# Patient Record
Sex: Male | Born: 1955 | Race: White | Hispanic: No | Marital: Single | Smoking: Never smoker
Health system: Southern US, Community
[De-identification: ages and names within clinical notes are randomized; demographics above are authoritative.]

## PROBLEM LIST (undated history)

## (undated) DIAGNOSIS — I10 Essential (primary) hypertension: Secondary | ICD-10-CM

## (undated) DIAGNOSIS — N400 Enlarged prostate without lower urinary tract symptoms: Secondary | ICD-10-CM

## (undated) DIAGNOSIS — J8410 Pulmonary fibrosis, unspecified: Secondary | ICD-10-CM

## (undated) HISTORY — PX: SHOULDER SURGERY: SHX246

---

## 2010-01-27 DIAGNOSIS — I10 Essential (primary) hypertension: Secondary | ICD-10-CM | POA: Insufficient documentation

## 2017-07-08 DIAGNOSIS — G4733 Obstructive sleep apnea (adult) (pediatric): Secondary | ICD-10-CM | POA: Insufficient documentation

## 2018-08-21 ENCOUNTER — Other Ambulatory Visit: Payer: Self-pay

## 2018-08-21 DIAGNOSIS — Z20822 Contact with and (suspected) exposure to covid-19: Secondary | ICD-10-CM

## 2018-08-22 LAB — NOVEL CORONAVIRUS, NAA: SARS-CoV-2, NAA: NOT DETECTED

## 2018-11-21 ENCOUNTER — Other Ambulatory Visit: Payer: Self-pay

## 2018-11-21 DIAGNOSIS — Z20822 Contact with and (suspected) exposure to covid-19: Secondary | ICD-10-CM

## 2018-11-23 LAB — NOVEL CORONAVIRUS, NAA: SARS-CoV-2, NAA: NOT DETECTED

## 2019-12-25 ENCOUNTER — Ambulatory Visit (HOSPITAL_COMMUNITY): Admit: 2019-12-25 | Discharge status: Against Medical Advice | Payer: Managed Care, Other (non HMO)

## 2019-12-26 ENCOUNTER — Other Ambulatory Visit: Payer: Self-pay

## 2019-12-26 ENCOUNTER — Encounter (HOSPITAL_COMMUNITY): Payer: Self-pay

## 2019-12-26 ENCOUNTER — Ambulatory Visit (HOSPITAL_COMMUNITY)
Admission: EM | Admit: 2019-12-26 | Discharge: 2019-12-26 | Disposition: A | Discharge status: Home / Self Care | Payer: Managed Care, Other (non HMO) | Attending: Internal Medicine | Admitting: Internal Medicine

## 2019-12-26 DIAGNOSIS — J029 Acute pharyngitis, unspecified: Secondary | ICD-10-CM

## 2019-12-26 DIAGNOSIS — J069 Acute upper respiratory infection, unspecified: Secondary | ICD-10-CM | POA: Diagnosis not present

## 2019-12-26 DIAGNOSIS — Z20822 Contact with and (suspected) exposure to covid-19: Secondary | ICD-10-CM | POA: Diagnosis not present

## 2019-12-26 DIAGNOSIS — R059 Cough, unspecified: Secondary | ICD-10-CM

## 2019-12-26 DIAGNOSIS — J358 Other chronic diseases of tonsils and adenoids: Secondary | ICD-10-CM | POA: Diagnosis present

## 2019-12-26 HISTORY — DX: Benign prostatic hyperplasia without lower urinary tract symptoms: N40.0

## 2019-12-26 HISTORY — DX: Pulmonary fibrosis, unspecified: J84.10

## 2019-12-26 HISTORY — DX: Essential (primary) hypertension: I10

## 2019-12-26 LAB — POCT RAPID STREP A, ED / UC: Streptococcus, Group A Screen (Direct): NEGATIVE

## 2019-12-26 LAB — SARS CORONAVIRUS 2 (TAT 6-24 HRS): SARS Coronavirus 2: NEGATIVE

## 2019-12-26 MED ORDER — CEFUROXIME AXETIL 500 MG PO TABS: 500.0000 mg | ORAL_TABLET | Freq: Two times a day (BID) | ORAL | 0 refills | Status: DC

## 2019-12-26 NOTE — Discharge Instructions (Signed)
Your rapid strep test today was negative. We will send that for culture and let you know if any treatment is needed. Check your MyChart for Covid test results.  COVID testing ordered. It will take between 3-7 days for test results. Someone will contact you regarding abnormal results or you can access your results through MyChart.   In the meantime you should... Remain isolated in your home for 10 days from symptom onset AND greater than 48 hours of no fever without the use of fever-reducing medication Get plenty of rest and fluids Flonase for nasal congestion and/or runny nose You can take OTC Zyrtec-D for nasal congestion, runny nose, and/or sore throat Use these medications as directed for symptom relief Use Tylenol or Ibuprofen as needed for fever or pain Return or go to the ER for any worsening or new symptoms such as high fever, worsening cough, shortness of breath, chest tightness, chest pain, changes in mental status, etc.

## 2019-12-26 NOTE — ED Triage Notes (Signed)
Pt presents with sore throat and productive cough X 1 week; pt states he had negative covid test on Monday & Wednesday.

## 2019-12-26 NOTE — ED Provider Notes (Signed)
[facility]    CSN: [id number] Arrival date & time: 12/26/19  0801   History   Chief Complaint Chief Complaint  Patient presents with  . Cough  . Sore Throat    HPI Fred Kennedy is a 64 y.o. male presents to urgent care today with complaints of sore throat and congestion. Patient states he flew to [place] on Friday for appointment with his PCP as he used to reside there. He was unable to see PCP due to covid symptoms. Patient reports sore throat x5 days progressed to nasal congestion. He states he felt "wheezy" last night with mild productive cough however denies any shortness of breath. Patient denies recent headache, facial pain, fever or chills, no loss of smell or taste. Somewhat better today and OTC cold and sinus medicine seems to be working. Fully vaccinated against COVID-19 with booster in 11/21. Patient requesting Covid PCR for upcoming travel.   Past Medical History:  Diagnosis Date  . Enlarged prostate   . Hypertension   . Pulmonary fibrosis, unspecified (HCC)     There are no problems to display for this patient.   Past Surgical History:  Procedure Laterality Date  . SHOULDER SURGERY        Home Medications    Prior to Admission medications   Not on File    Family History Family History  Family history unknown: Yes    Social History Social History   Tobacco Use  . Smoking status: Never Smoker     Allergies   Penicillins   Review of Systems As stated in HPI otherwise negative   Physical Exam Triage Vital Signs ED Triage Vitals  Enc Vitals Group     BP 12/26/19 0821 (!) 152/88     Pulse Rate 12/26/19 0821 78     Resp 12/26/19 0821 16     Temp 12/26/19 0821 98.1 F (36.7 C)     Temp Source 12/26/19 0821 Oral     SpO2 12/26/19 0821 99 %     Weight --      Height --      Head Circumference --      Peak Flow --      Pain Score 12/26/19 0829 5     Pain Loc --      Pain Edu? --      Excl. in GC? --    No data  found.  Updated Vital Signs BP (!) 152/88 (BP Location: Left Arm)   Pulse 78   Temp 98.1 F (36.7 C) (Oral)   Resp 16   SpO2 99%   Visual Acuity Right Eye Distance:   Left Eye Distance:   Bilateral Distance:    Right Eye Near:   Left Eye Near:    Bilateral Near:     Physical Exam Constitutional:      General: He is not in acute distress.    Appearance: He is well-developed. He is not ill-appearing.  HENT:     Head: Normocephalic and atraumatic.     Right Ear: No drainage. Tympanic membrane is not erythematous.     Left Ear: No drainage. Tympanic membrane is not erythematous.     Ears:     Comments: Positive air-fluid levels bilaterally    Nose: Congestion and rhinorrhea present.     Mouth/Throat:     Mouth: Mucous membranes are moist.     Pharynx: Uvula midline.     Comments: Mild posterior erythema without swelling. Pustular lesion on left  tonsil consistent with tonsillar stone Cardiovascular:     Rate and Rhythm: Normal rate and regular rhythm.  Pulmonary:     Effort: Pulmonary effort is normal.     Breath sounds: Normal breath sounds. No wheezing, rhonchi or rales.  Musculoskeletal:     Cervical back: Normal range of motion and neck supple.  Lymphadenopathy:     Cervical: No cervical adenopathy.  Skin:    General: Skin is warm and dry.  Neurological:     General: No focal deficit present.     Mental Status: He is alert and oriented to person, place, and time.  Psychiatric:        Mood and Affect: Mood normal.        Behavior: Behavior normal.      UC Treatments / Results  Labs (all labs ordered are listed, but only abnormal results are displayed) Labs Reviewed  SARS CORONAVIRUS 2 (TAT 6-24 HRS)  CULTURE, GROUP A STREP [facility])  POCT RAPID STREP A, ED / UC    EKG   Radiology No results found.  Procedures Procedures (including critical care time)  Medications Ordered in UC Medications - No data to display  Initial Impression / Assessment and  Plan / UC Course  I have reviewed the triage vital signs and the nursing notes.  Pertinent labs & imaging results that were available during my care of the patient were reviewed by me and considered in my medical decision making (see chart for details).    Viral URI -low suspicion for Covid-19 has pt fully immunized c booster in early fall.  -symptoms improved on OTC sinus decongestant  -rapid strep neg, will send for culture -Zyrtec, flonase, tylenol and/or motrin prn -will send covid PCR due to upcoming travel  Reviewed expections re: course of current medical issues. Questions answered. Outlined signs and symptoms indicating need for more acute intervention. Pt verbalized understanding. AVS given  Final Clinical Impressions(s) / UC Diagnoses   Final diagnoses:  Viral upper respiratory tract infection  Tonsil stone     Discharge Instructions     Your rapid strep test today was negative. We will send that for culture and let you know if any treatment is needed. Check your MyChart for Covid test results.  COVID testing ordered. It will take between 3-7 days for test results. Someone will contact you regarding abnormal results or you can access your results through MyChart.   In the meantime you should... . Remain isolated in your home for 10 days from symptom onset AND greater than 48 hours of no fever without the use of fever-reducing medication . Get plenty of rest and fluids . Flonase for nasal congestion and/or runny nose . You can take OTC Zyrtec-D for nasal congestion, runny nose, and/or sore throat . Use these medications as directed for symptom relief . Use Tylenol or Ibuprofen as needed for fever or pain . Return or go to the ER for any worsening or new symptoms such as high fever, worsening cough, shortness of breath, chest tightness, chest pain, changes in mental status, etc.     ED Prescriptions    Medication Sig Dispense Auth. Provider   cefUROXime (CEFTIN)  500 MG tablet  (Status: Discontinued) Take 1 tablet (500 mg total) by mouth 2 (two) times daily with a meal for 5 days. 10 tablet Rolla Etienne, NP     PDMP not reviewed this encounter.   Rolla Etienne, NP 12/26/19 2111

## 2019-12-28 LAB — CULTURE, GROUP A STREP (THRC)

## 2019-12-31 ENCOUNTER — Ambulatory Visit: Payer: Self-pay | Admitting: Family Medicine

## 2020-01-30 ENCOUNTER — Encounter: Payer: Self-pay | Admitting: Family Medicine

## 2020-01-30 ENCOUNTER — Ambulatory Visit: Payer: Managed Care, Other (non HMO) | Admitting: Family Medicine

## 2020-01-30 ENCOUNTER — Other Ambulatory Visit: Payer: Self-pay

## 2020-01-30 ENCOUNTER — Ambulatory Visit
Admission: RE | Admit: 2020-01-30 | Discharge: 2020-01-30 | Disposition: A | Discharge status: Home / Self Care | Payer: Managed Care, Other (non HMO) | Source: Ambulatory Visit | Attending: Family Medicine | Admitting: Family Medicine

## 2020-01-30 VITALS — BP 139/82 | HR 75 | Ht 71.5 in | Wt 229.8 lb

## 2020-01-30 DIAGNOSIS — J8410 Pulmonary fibrosis, unspecified: Secondary | ICD-10-CM

## 2020-01-30 DIAGNOSIS — G4733 Obstructive sleep apnea (adult) (pediatric): Secondary | ICD-10-CM | POA: Diagnosis not present

## 2020-01-30 DIAGNOSIS — Z0000 Encounter for general adult medical examination without abnormal findings: Secondary | ICD-10-CM

## 2020-01-30 DIAGNOSIS — R0600 Dyspnea, unspecified: Secondary | ICD-10-CM

## 2020-01-30 DIAGNOSIS — R972 Elevated prostate specific antigen [PSA]: Secondary | ICD-10-CM | POA: Diagnosis not present

## 2020-01-30 DIAGNOSIS — R0609 Other forms of dyspnea: Secondary | ICD-10-CM

## 2020-01-30 DIAGNOSIS — I10 Essential (primary) hypertension: Secondary | ICD-10-CM

## 2020-01-30 NOTE — Progress Notes (Signed)
Office Visit Note   Patient: Fred Kennedy           Date of Birth: April 14, 1955           MRN: [id number] Visit Date: 01/30/2020 Requested by: No referring provider defined for this encounter. PCP: Patient, No Pcp Per  Subjective: Chief Complaint  Patient presents with  . Other    Establish primary care  Recheck lungs post ED visit    HPI: He is here to establish care. He moved from [place] 2 years ago to work for the [employer]. He still maintains his PCP in [place], but wanted to establish with somebody local in case any issues occur.  Last month he developed a respiratory issue. He was treated with Zithromax after strep test and COVID test came back negative. His symptoms improved significantly and he does not feel sick anymore, but his girlfriend states that when he walks for exercise, he seems to get a little bit short of breath going up hills. Patient at one point was told that he might have pulmonary fibrosis but after further testing and treatment, his symptoms resolved and he was told that it might not be pulmonary fibrosis but that it could come back in the future. He is interested in getting a chest x-ray to be sure there are no abnormalities.  He has a history of elevated PSA. It has remained stable over the years at around 5. He has had multiple MRI scans of the prostate and a biopsy, the biopsy was negative and most recent MRI scans have been normal. He does have BPH symptoms and takes Flomax for that. Even still, he has difficulty with urinary stream. He would like to establish with a local urologist.  He has hypertension which has been moderately well controlled with metoprolol.  He has gained 25 pounds in the past year due to poor eating habits and not getting enough exercise. He plans to make some changes in the near future.  He has had troubles with his right knee over the years and was told based on MRI scan that he had a lot of arthritis in the joint as well as a  shredded meniscus cartilage. In the past couple months it has started to bother him more and Celebrex does not seem to be helping that much.                ROS:   All other systems were reviewed and are negative.  Objective: Vital Signs: BP 139/82   Pulse 75   Ht 5' 11.5" (1.816 m)   Wt 229 lb 12.8 oz (104.2 kg)   BMI 31.60 kg/m   Physical Exam:  General:  Alert and oriented, in no acute distress. Pulm:  Breathing unlabored. Psy:  Normal mood, congruent affect.  HEENT:  [place]/AT, PERRLA, EOM Full, no nystagmus.  Funduscopic examination within normal limits.  No conjunctival erythema.  Tympanic membranes are pearly gray with normal landmarks.  External ear canals are normal.  Nasal passages are clear.  Oropharynx is clear.  No significant lymphadenopathy.  No thyromegaly or nodules.  2+ carotid pulses without bruits. CV: Regular rate and rhythm without murmurs, rubs, or gallops.  No peripheral edema.  2+ radial and posterior tibial pulses. Lungs: Clear to auscultation throughout with no wheezing or areas of consolidation. Extremities: His right knee has trace effusion with no warmth. 1-2+ patellofemoral crepitus. Tender on the medial joint line, also a palpable click with McMurray's.    Imaging:  No results found.  Assessment & Plan: 1. status post recent respiratory infection with shortness of breath on exertion, could be simply from delayed recovery of lungs or possibly recent weight gain with deconditioning. -We will order a chest x-ray to be read by radiology. If normal, then he will work on weight loss and exercise.  2. BPH with elevated PSA -Referral to Dr. Laverle Patter.  3. Hypertension -He will monitor at home. Call for refills when needed.  4. 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 If symptoms worsen, could inject with cortisone, gel, PRP, dextrose.     Procedures: No procedures performed        PMFS  History: Patient Active Problem List   Diagnosis Date Noted  . Elevated PSA 01/30/2020  . Pulmonary fibrosis (HCC) 01/30/2020  . Obstructive sleep apnea (adult) (pediatric) 07/08/2017  . Hypertension 01/27/2010   Past Medical History:  Diagnosis Date  . Enlarged prostate   . Hypertension   . Pulmonary fibrosis, unspecified (HCC)     Family History  Family history unknown: Yes    Past Surgical History:  Procedure Laterality Date  . SHOULDER SURGERY     Social History   Occupational History  . Not on file  Tobacco Use  . Smoking status: Never Smoker  . Smokeless tobacco: Not on file  Substance and Sexual Activity  . Alcohol use: Not on file  . Drug use: Not on file  . Sexual activity: Not on file

## 2020-01-31 ENCOUNTER — Telehealth: Payer: Self-pay | Admitting: Family Medicine

## 2020-01-31 NOTE — Telephone Encounter (Signed)
Chest xray looks normal.

## 2020-07-20 ENCOUNTER — Encounter: Payer: Self-pay | Admitting: Physician Assistant

## 2020-07-20 ENCOUNTER — Telehealth: Payer: Managed Care, Other (non HMO) | Admitting: Physician Assistant

## 2020-07-20 DIAGNOSIS — H1032 Unspecified acute conjunctivitis, left eye: Secondary | ICD-10-CM | POA: Diagnosis not present

## 2020-07-20 DIAGNOSIS — U071 COVID-19: Secondary | ICD-10-CM | POA: Diagnosis not present

## 2020-07-20 MED ORDER — POLYMYXIN B-TRIMETHOPRIM 10000-0.1 UNIT/ML-% OP SOLN: 1.0000 [drp] | OPHTHALMIC | 0 refills | Status: AC

## 2020-07-20 MED ORDER — BENZONATATE 100 MG PO CAPS: 100.0000 mg | ORAL_CAPSULE | Freq: Three times a day (TID) | ORAL | 0 refills | Status: AC | PRN

## 2020-07-20 NOTE — Progress Notes (Signed)
Mr. Fred Kennedy, humm are scheduled for a virtual visit with your provider today.    Just as we do with appointments in the office, we must obtain your consent to participate.  Your consent will be active for this visit and any virtual visit you may have with one of our providers in the next 365 days.    If you have a MyChart account, I can also send a copy of this consent to you electronically.  All virtual visits are billed to your insurance company just like a traditional visit in the office.  As this is a virtual visit, video technology does not allow for your provider to perform a traditional examination.  This may limit your provider's ability to fully assess your condition.  If your provider identifies any concerns that need to be evaluated in person or the need to arrange testing such as labs, EKG, etc, we will make arrangements to do so.    Although advances in technology are sophisticated, we cannot ensure that it will always work on either your end or our end.  If the connection with a video visit is poor, we may have to switch to a telephone visit.  With either a video or telephone visit, we are not always able to ensure that we have a secure connection.   I need to obtain your verbal consent now.   Are you willing to proceed with your visit today?   [name] has provided verbal consent on 07/20/2020 for a virtual visit (video or telephone).   [name], PA-C 07/20/2020  4:45 PM  Virtual Visit Consent   [name], you are scheduled for a virtual visit with a [facility] provider today.     Just as with appointments in the office, your consent must be obtained to participate.  Your consent will be active for this visit and any virtual visit you may have with one of our providers in the next 365 days.     If you have a MyChart account, a copy of this consent can be sent to you electronically.  All virtual visits are billed to your insurance company just like a traditional  visit in the office.    As this is a virtual visit, video technology does not allow for your provider to perform a traditional examination.  This may limit your provider's ability to fully assess your condition.  If your provider identifies any concerns that need to be evaluated in person or the need to arrange testing (such as labs, EKG, etc.), we will make arrangements to do so.     Although advances in technology are sophisticated, we cannot ensure that it will always work on either your end or our end.  If the connection with a video visit is poor, the visit may have to be switched to a telephone visit.  With either a video or telephone visit, we are not always able to ensure that we have a secure connection.     I need to obtain your verbal consent now.   Are you willing to proceed with your visit today?    [name] has provided verbal consent on 07/20/2020 for a virtual visit (video or telephone).   [name], PA-C   Date: 07/20/2020 4:45 PM   Virtual Visit via Video Note   IMargaretann Loveless, connected with  [name]  ([id number], July 08, 1955) on 07/20/20 at  5:00 PM EDT by a video-enabled telemedicine application and verified that I  am speaking with the correct person using two identifiers.  Location: Patient: Virtual Visit Location Patient: Home Provider: Virtual Visit Location Provider: Home Office   I discussed the limitations of evaluation and management by telemedicine and the availability of in person appointments. The patient expressed understanding and agreed to proceed.    History of Present Illness: Fred Kennedy is a 65 y.o. who identifies as a male who was assigned male at birth, and is being seen today for Covid 19, tested positive on Saturday, 07/18/20, with a PCR test, tested positive Friday, 07/17/20, on an at home rapid test.  HPI: URI  This is a new problem. Episode onset: Symptoms started on Monday, Tuesday last week. The problem has been  gradually improving. There has been no fever. Associated symptoms include congestion, coughing, sinus pain (improved now) and a sore throat. Associated symptoms comments: Fatigue. Treatments tried: Vit C, Vit D, Mucinex. The treatment provided moderate relief.  Overall, symptoms were improving, but today he worked from home and has had to talk a lot. Feels more throat strain, more dry cough, and increased fatigue today. Interested in anti-viral treatment.   Also noted to have developed a red eye around Wednesday of last week. Redness has since improved but having a slight purulent discharge and some pain still. Has been using rewetting eye drops.    Problems:  Patient Active Problem List   Diagnosis Date Noted   Elevated PSA 01/30/2020   Pulmonary fibrosis (HCC) 01/30/2020   Obstructive sleep apnea (adult) (pediatric) 07/08/2017   Hypertension 01/27/2010    Allergies:  Allergies  Allergen Reactions   Penicillins    Medications:  Current Outpatient Medications:    celecoxib (CELEBREX) 200 MG capsule, Take 200 mg by mouth daily., Disp: , Rfl:    Clindamycin-Benzoyl Per, Refr, gel, , Disp: , Rfl:    hydrocortisone 2.5 % ointment, Apply topically., Disp: , Rfl:    metoprolol succinate (TOPROL-XL) 50 MG 24 hr tablet, Take 50 mg by mouth 2 (two) times daily., Disp: , Rfl:    tamsulosin (FLOMAX) 0.4 MG CAPS capsule, Take 0.4 mg by mouth daily., Disp: , Rfl:   Observations/Objective: Patient is well-developed, well-nourished in no acute distress.  Resting comfortably at home.  Head is normocephalic, atraumatic.  No labored breathing. No adventitious lung sounds heard through video call Speech is clear and coherent with logical content.  Patient is alert and oriented at baseline.    Assessment and Plan: There are no diagnoses linked to this encounter. - Continue OTC symptomatic management of choice - Will send OTC vitamins and supplement information through AVS - Advised he is not a  candidate for anti-viral treatment as symptom onset was 7 days ago - Polytrim eye drops provided for secondary conjunctivitis - Tessalon perles for cough while working - Push fluids - Rest as needed - Referral to Covid treatment team placed, consult appreciated - Discussed return precautions and when to seek in-person evaluation, sent via AVS as well  Follow Up Instructions: I discussed the assessment and treatment plan with the patient. The patient was provided an opportunity to ask questions and all were answered. The patient agreed with the plan and demonstrated an understanding of the instructions.  A copy of instructions were sent to the patient via MyChart.  The patient was advised to call back or seek an in-person evaluation if the symptoms worsen or if the condition fails to improve as anticipated.  Time:  I spent 16 minutes with the patient via  telehealth technology discussing the above problems/concerns.    [name], PA-C

## 2020-07-20 NOTE — Patient Instructions (Signed)
COVID-19: What to Do if You Are Sick CDC has updated isolation and quarantine recommendations for the public, and is revising the CDC website to reflect these changes. These recommendations do not apply to healthcare personnel and do not supersede state, local, tribal, or territorial laws, rules, andregulations. If you have a fever, cough or other symptoms, you might have COVID-19. Most people have mild illness and are able to recover at home. If you are sick: Keep track of your symptoms. If you have an emergency warning sign (including trouble breathing), call 911. Steps to help prevent the spread of COVID-19 if you are sick If you are sick with COVID-19 or think you might have COVID-19, follow the steps below to care for yourself and to help protect other peoplein your home and community. Stay home except to get medical care Stay home. Most people with COVID-19 have mild illness and can recover at home without medical care. Do not leave your home, except to get medical care. Do not visit public areas. Take care of yourself. Get rest and stay hydrated. Take over-the-counter medicines, such as acetaminophen, to help you feel better. Stay in touch with your doctor. Call before you get medical care. Be sure to get care if you have trouble breathing, or have any other emergency warning signs, or if you think it is an emergency. Avoid public transportation, ride-sharing, or taxis. Separate yourself from other people As much as possible, stay in a specific room and away from other people and pets in your home. If possible, you should use a separate bathroom. If you need to be around other people or animals in oroutside of the home, wear a mask. Tell your close contactsthat they may have been exposed to COVID-19. An infected person can spread COVID-19 starting 48 hours (or 2 days) before the person has any symptoms or tests positive. By letting your close contacts know they may have been exposed to COVID-19,  you are helping to protect everyone. Additional guidance is available for those living in close quarters and shared housing. See COVID-19 and Animals if you have questions about pets. If you are diagnosed with COVID-19, someone from the health department may call you. Answer the call to slow the spread. Monitor your symptoms Symptoms of COVID-19 include fever, cough, or other symptoms. Follow care instructions from your healthcare provider and local health department. Your local health authorities may give instructions on checking your symptoms and reporting information. When to seek emergency medical attention Look for emergency warning signs* for COVID-19. If someone is showing any of these signs, seek emergency medical care immediately: Trouble breathing Persistent pain or pressure in the chest New confusion Inability to wake or stay awake Pale, gray, or blue-colored skin, lips, or nail beds, depending on skin tone *This list is not all possible symptoms. Please call your medical provider forany other symptoms that are severe or concerning to you. Call 911 or call ahead to your local emergency facility: Notify the operator that you are seeking care for someone who has or may haveCOVID-19. Call ahead before visiting your doctor Call ahead. Many medical visits for routine care are being postponed or done by phone or telemedicine. If you have a medical appointment that cannot be postponed, call your doctor's office, and tell them you have or may have COVID-19. This will help the office protect themselves and other patients. Get tested If you have symptoms of COVID-19, get tested. While waiting for test results, you stay away from others,   including staying apart from those living in your household. Self-tests are one of several options for testing for the virus that causes COVID-19 and may be more convenient than laboratory-based tests and point-of-care tests. Ask your healthcare provider or  your local health department if you need help interpreting your test results. You can visit your state, tribal, local, and territorial health department's website to look for the latest local information on testing sites. If you are sick, wear a mask over your nose and mouth You should wear a mask over your nose and mouth if you must be around other people or animals, including pets (even at home). You don't need to wear the mask if you are alone. If you can't put on a mask (because of trouble breathing, for example), cover your coughs and sneezes in some other way. Try to stay at least 6 feet away from other people. This will help protect the people around you. Masks should not be placed on young children under age 2 years, anyone who has trouble breathing, or anyone who is not able to remove the mask without help. Note: During the COVID-19 pandemic, medical grade facemasks are reserved forhealthcare workers and some first responders. Cover your coughs and sneezes Cover your mouth and nose with a tissue when you cough or sneeze. Throw away used tissues in a lined trash can. Immediately wash your hands with soap and water for at least 20 seconds. If soap and water are not available, clean your hands with an alcohol-based hand sanitizer that contains at least 60% alcohol. Clean your hands often Wash your hands often with soap and water for at least 20 seconds. This is especially important after blowing your nose, coughing, or sneezing; going to the bathroom; and before eating or preparing food. Use hand sanitizer if soap and water are not available. Use an alcohol-based hand sanitizer with at least 60% alcohol, covering all surfaces of your hands and rubbing them together until they feel dry. Soap and water are the best option, especially if hands are visibly dirty. Avoid touching your eyes, nose, and mouth with unwashed hands. Handwashing Tips Avoid sharing personal household items Do not share  dishes, drinking glasses, cups, eating utensils, towels, or bedding with other people in your home. Wash these items thoroughly after using them with soap and water or put in the dishwasher. Clean all "high-touch" surfaces every day Clean and disinfect high-touch surfaces in your "sick room" and bathroom; wear disposable gloves. Let someone else clean and disinfect surfaces in common areas, but you should clean your bedroom and bathroom, if possible. If a caregiver or other person needs to clean and disinfect a sick person's bedroom or bathroom, they should do so on an as-needed basis. The caregiver/other person should wear a mask and disposable gloves prior to cleaning. They should wait as long as possible after the person who is sick has used the bathroom before coming in to clean and use the bathroom. High-touch surfaces include phones, remote controls, counters, tabletops, doorknobs, bathroom fixtures, toilets, keyboards, tablets, and bedside tables. Clean and disinfect areas that may have blood, stool, or body fluids on them. Use household cleaners and disinfectants. Clean the area or item with soap and water or another detergent if it is dirty. Then, use a household disinfectant. Be sure to follow the instructions on the label to ensure safe and effective use of the product. Many products recommend keeping the surface wet for several minutes to ensure germs are killed. Many   also recommend precautions such as wearing gloves and making sure you have good ventilation during use of the product. Use a product from [employer] List N: Disinfectants for Coronavirus (COVID-19). Complete Disinfection Guidance When you can be around others after being sick with COVID-19 Deciding when you can be around others is different for different situations. Find out when you can safely end home isolation. For any additional questions about your care,contact your healthcare provider or state or local health  department. 12/18/2019 Content source: [facility] ([facility]), Division of Viral Diseases This information is not intended to replace advice given to you by your health care provider. Make sure you discuss any questions you have with your healthcare provider. Document Revised: 02/14/2020 Document Reviewed: 02/14/2020 Elsevier Patient Education  2022 [employer]   Can take to lessen severity: Vit C 500mg  twice daily Quercertin 250-500mg  twice daily Zinc 75-100mg  daily Melatonin 3-6 mg at bedtime Vit D3 1000-2000 IU daily Aspirin 81 mg daily with food Optional: Famotidine 20mg  daily Also can add tylenol/ibuprofen as needed for fevers and body aches May add Mucinex or Mucinex DM as needed for cough/congestion  10 Things You Can Do to Manage Your COVID-19 Symptoms at Home If you have possible or confirmed COVID-19 Stay home except to get medical care. Monitor your symptoms carefully. If your symptoms get worse, call your healthcare provider immediately. Get rest and stay hydrated. If you have a medical appointment, call the healthcare provider ahead of time and tell them that you have or may have COVID-19. For medical emergencies, call 911 and notify the dispatch personnel that you have or may have COVID-19. Cover your cough and sneezes with a tissue or use the inside of your elbow. Wash your hands often with soap and water for at least 20 seconds or clean your hands with an alcohol-based hand sanitizer that contains at least 60% alcohol. As much as possible, stay in a specific room and away from other people in your home. Also, you should use a separate bathroom, if available. If you need to be around other people in or outside of the home, wear a mask. Avoid sharing personal items with other people in your household, like dishes, towels, and bedding. Clean all surfaces that are touched often, like counters, tabletops, and doorknobs. Use  household cleaning sprays or wipes according to the label instructions. 07/26/2019 This information is not intended to replace advice given to you by your health care provider. Make sure you discuss any questions you have with your healthcare provider. Document Revised: 02/14/2020 Document Reviewed: 02/14/2020 Elsevier Patient Education  2022 04/13/2020.

## 2021-03-03 ENCOUNTER — Institutional Professional Consult (permissible substitution): Payer: Managed Care, Other (non HMO) | Admitting: Pulmonary Disease

## 2022-05-19 ENCOUNTER — Encounter: Payer: Self-pay | Admitting: Podiatry

## 2022-05-19 ENCOUNTER — Ambulatory Visit (INDEPENDENT_AMBULATORY_CARE_PROVIDER_SITE_OTHER): Payer: Medicare Other | Admitting: Podiatry

## 2022-05-19 DIAGNOSIS — D2372 Other benign neoplasm of skin of left lower limb, including hip: Secondary | ICD-10-CM | POA: Diagnosis not present

## 2022-05-19 NOTE — Progress Notes (Signed)
  Subjective:  Patient ID: Fred Kennedy, male    DOB: 11/22/1955,   MRN: [id number]  Chief Complaint  Patient presents with   Callouses     Left foot callus trim     67 y.o. male presents for concern of a lesion on the bottom of his left foot that has been bothering him for the past couple months. Relates there may be a couple areas. Denies any treatments. Relates it has been painful to walk on . Denies any other pedal complaints. Denies n/v/f/c.   Past Medical History:  Diagnosis Date   Enlarged prostate    Hypertension    Pulmonary fibrosis, unspecified (HCC)     Objective:  Physical Exam: Vascular: DP/PT pulses 2/4 bilateral. CFT <3 seconds. Normal hair growth on digits. No edema.  Skin. No lacerations or abrasions bilateral feet. Hyperkeratotic cored lesions noted to plantar first and fifth metatarsal heads with disruption of skin lines.  Musculoskeletal: MMT 5/5 bilateral lower extremities in DF, PF, Inversion and Eversion. Deceased ROM in DF of ankle joint.  Neurological: Sensation intact to light touch.   Assessment:   1. Benign neoplasm of skin of left lower extremity      Plan:  Patient was evaluated and treated and all questions answered. -Discussed benign lesions of the skin.  with patient and treatment options.  -Hyperkeratotic tissue was debrided with chisel without incident.  -Applied salycylic acid treatment to area with dressing. Advised to remove bandaging tomorrow.  -Encouraged daily moisturizing -Discussed use of pumice stone -Advised good supportive shoes and inserts -Patient to return to office as needed or sooner if condition worsens.   Louann Sjogren, DPM

## 2022-08-28 IMAGING — CR DG CHEST 2V
2 series · 2 of 2 positions shown · non-contrast
Comparison: None.

CLINICAL DATA: Dyspnea on exertion

EXAM:
CHEST - 2 VIEW

[w chest pa]
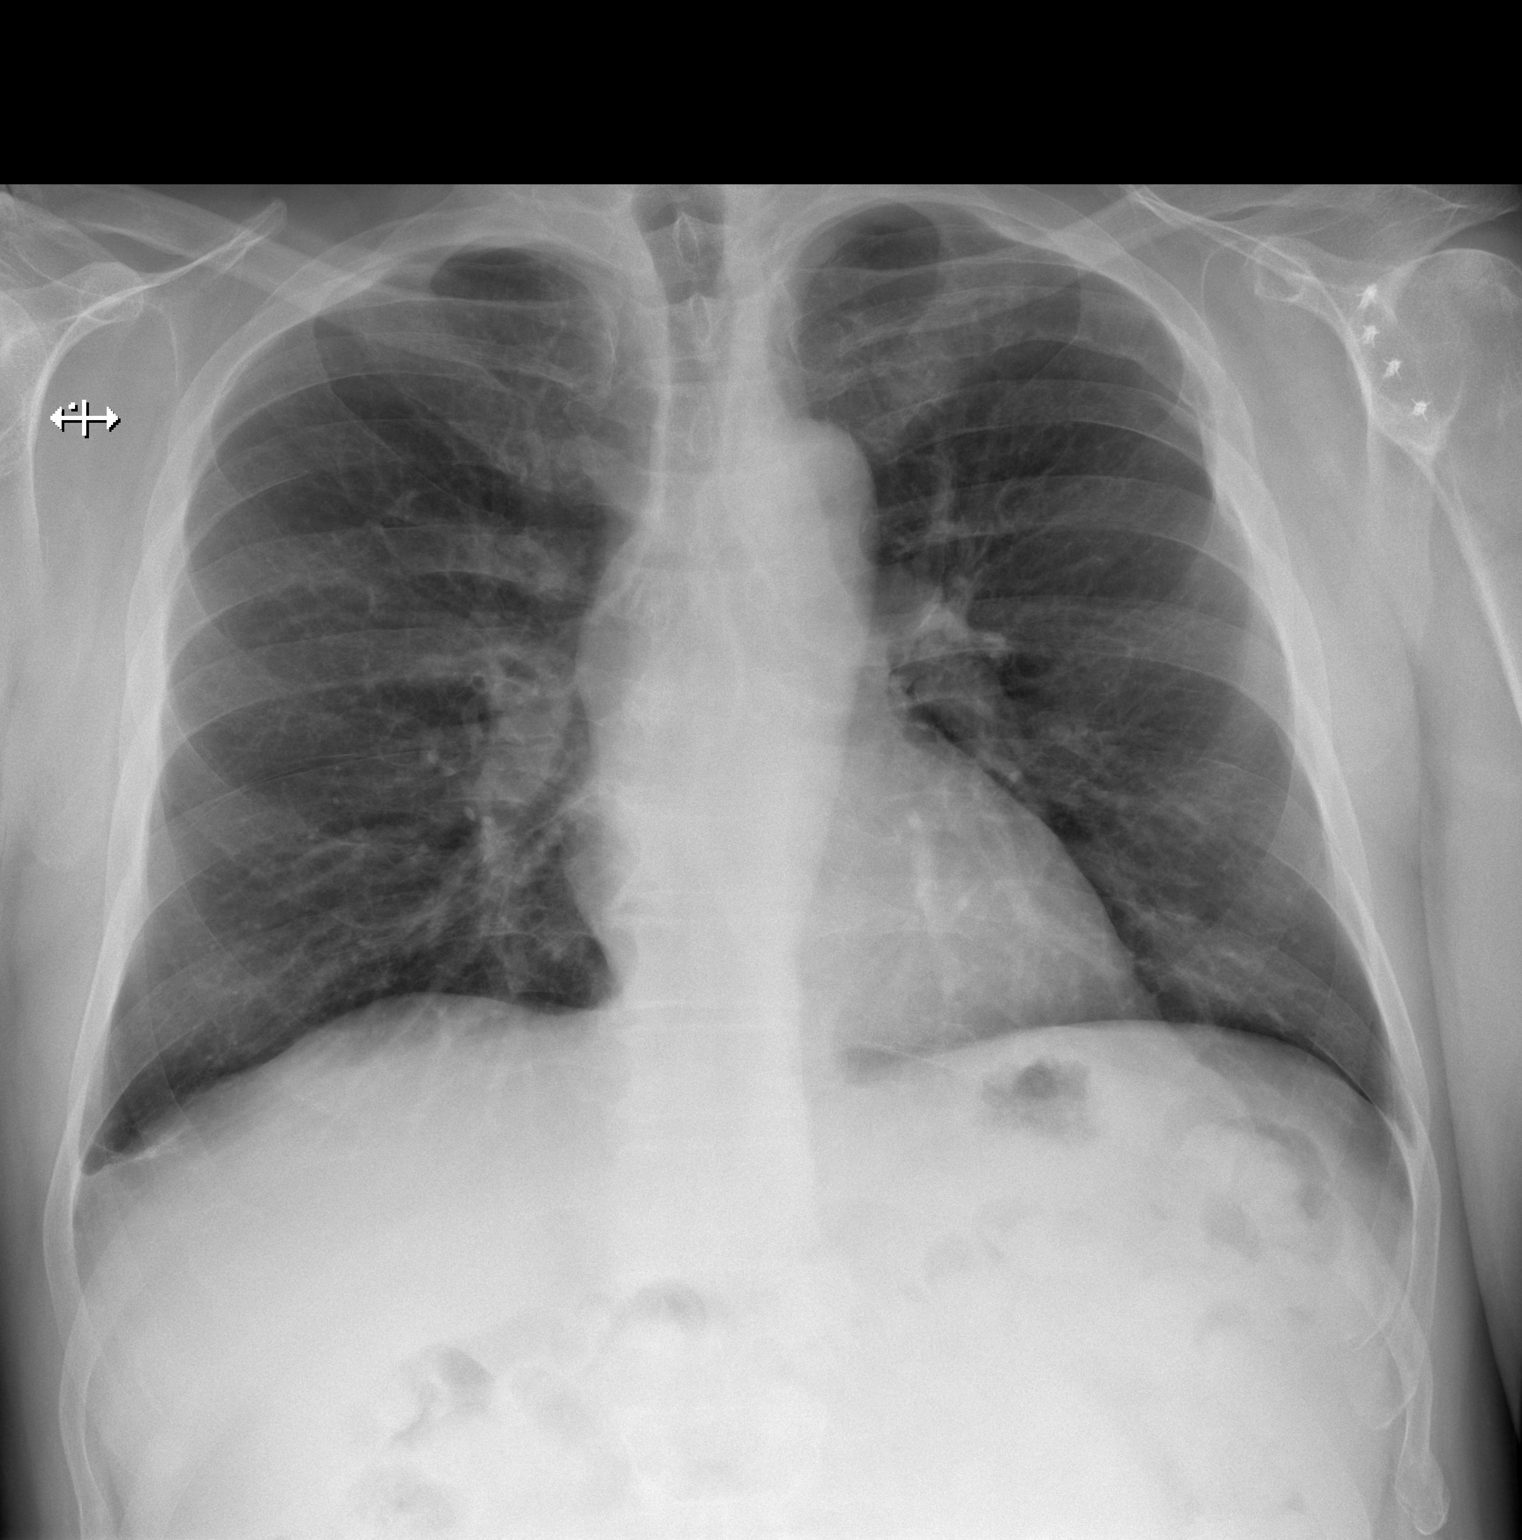

[w chest lat]
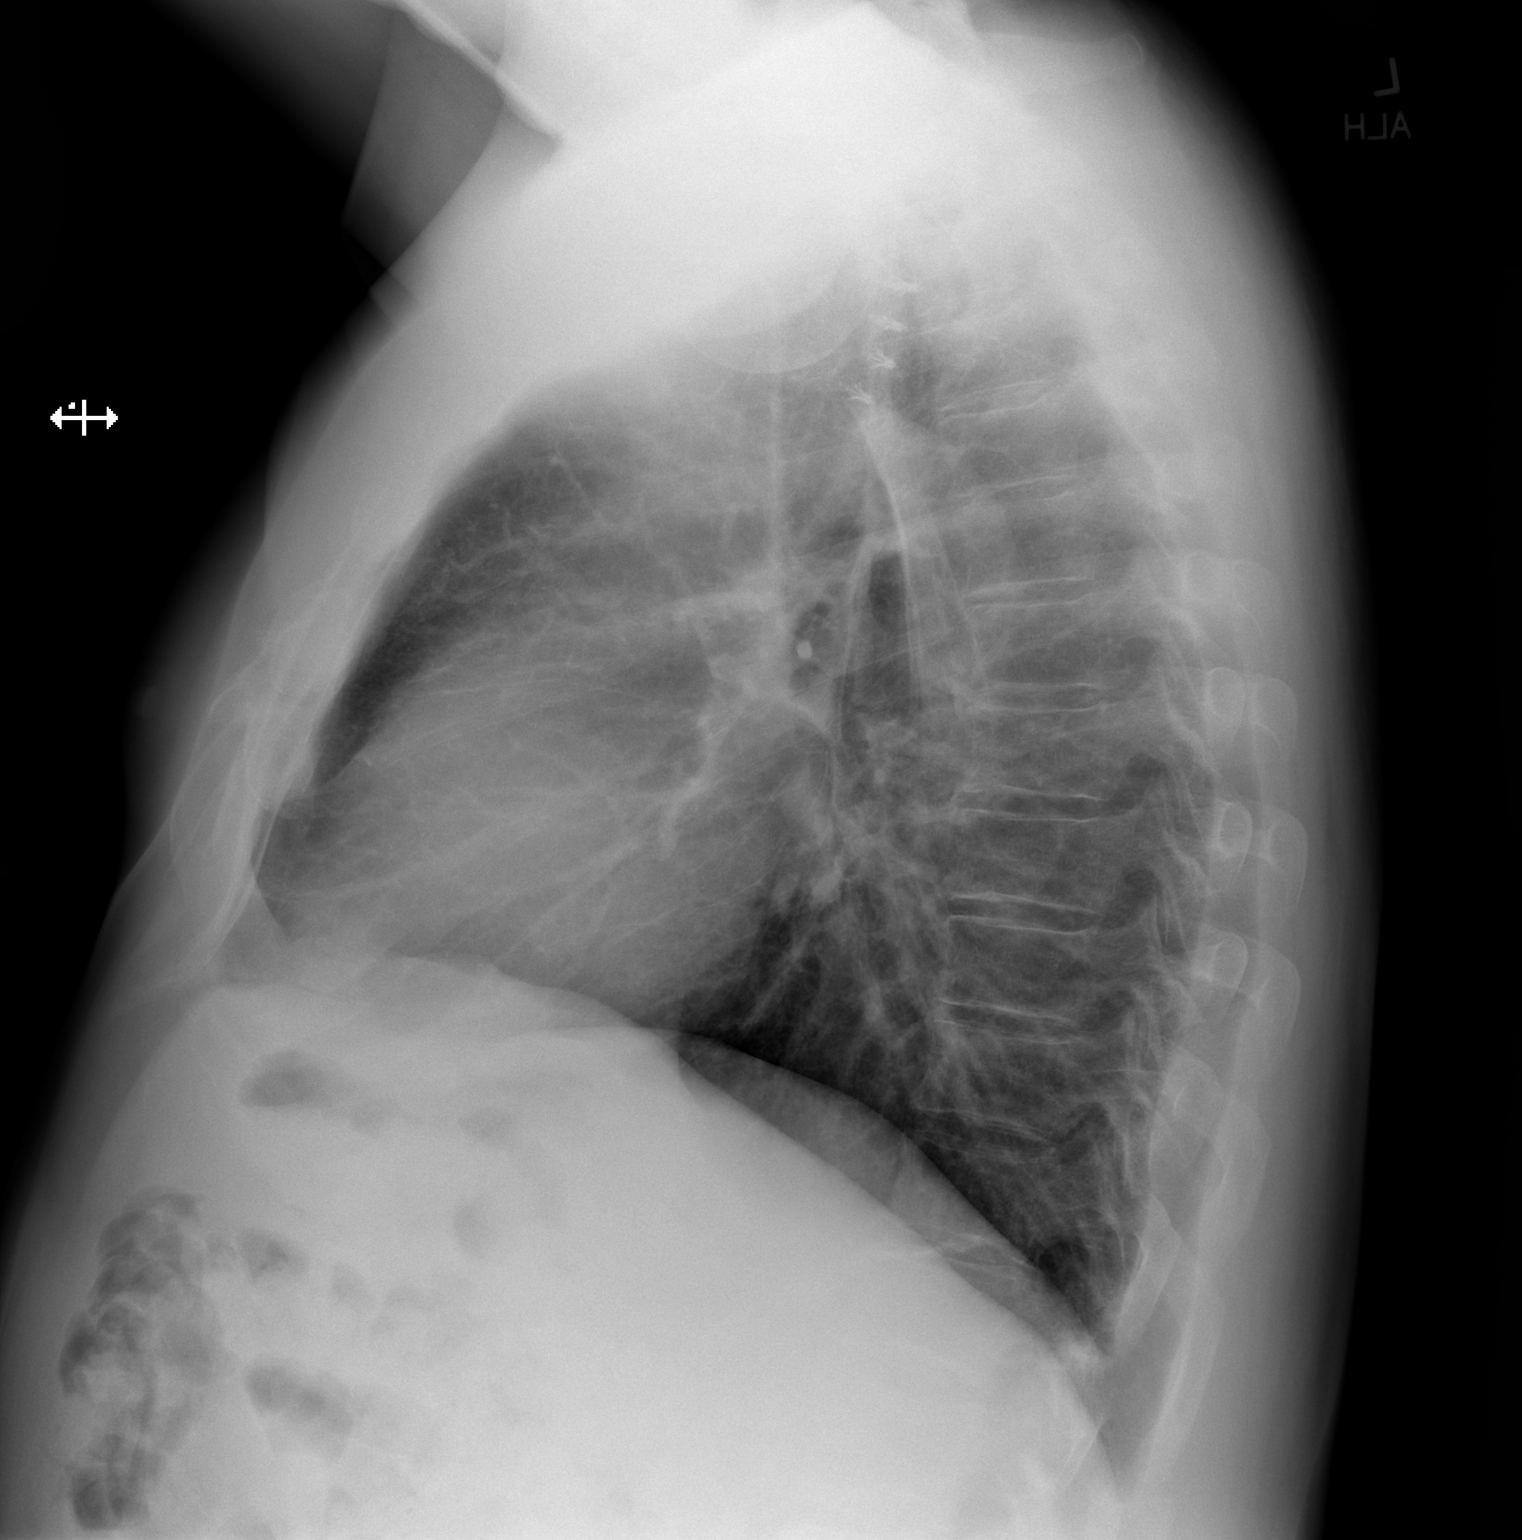

[2 of 2 positions shown; findings below may reference images not displayed]

FINDINGS: The heart size and mediastinal contours are within normal limits.
Both lungs are clear. The visualized skeletal structures are
unremarkable.
IMPRESSION: No active cardiopulmonary disease.
